# Patient Record
Sex: Male | Born: 1955 | Race: White | Hispanic: No | Marital: Married | Smoking: Never smoker
Health system: Southern US, Community
[De-identification: ages and names within clinical notes are randomized; demographics above are authoritative.]

## PROBLEM LIST (undated history)

## (undated) DIAGNOSIS — E079 Disorder of thyroid, unspecified: Secondary | ICD-10-CM

---

## 2015-06-05 ENCOUNTER — Encounter (HOSPITAL_COMMUNITY): Payer: Self-pay | Admitting: *Deleted

## 2015-06-05 ENCOUNTER — Emergency Department (HOSPITAL_COMMUNITY): Payer: BLUE CROSS/BLUE SHIELD

## 2015-06-05 ENCOUNTER — Emergency Department (HOSPITAL_COMMUNITY)
Admission: EM | Admit: 2015-06-05 | Discharge: 2015-06-05 | Disposition: A | Discharge status: Home / Self Care | Payer: BLUE CROSS/BLUE SHIELD | Attending: Emergency Medicine | Admitting: Emergency Medicine

## 2015-06-05 DIAGNOSIS — Z880 Allergy status to penicillin: Secondary | ICD-10-CM | POA: Insufficient documentation

## 2015-06-05 DIAGNOSIS — Y9389 Activity, other specified: Secondary | ICD-10-CM | POA: Insufficient documentation

## 2015-06-05 DIAGNOSIS — Y92410 Unspecified street and highway as the place of occurrence of the external cause: Secondary | ICD-10-CM | POA: Diagnosis not present

## 2015-06-05 DIAGNOSIS — S2231XA Fracture of one rib, right side, initial encounter for closed fracture: Secondary | ICD-10-CM

## 2015-06-05 DIAGNOSIS — S3992XA Unspecified injury of lower back, initial encounter: Secondary | ICD-10-CM | POA: Diagnosis not present

## 2015-06-05 DIAGNOSIS — Y998 Other external cause status: Secondary | ICD-10-CM | POA: Insufficient documentation

## 2015-06-05 DIAGNOSIS — S29001A Unspecified injury of muscle and tendon of front wall of thorax, initial encounter: Secondary | ICD-10-CM | POA: Diagnosis present

## 2015-06-05 DIAGNOSIS — S199XXA Unspecified injury of neck, initial encounter: Secondary | ICD-10-CM | POA: Insufficient documentation

## 2015-06-05 DIAGNOSIS — S0990XA Unspecified injury of head, initial encounter: Secondary | ICD-10-CM | POA: Insufficient documentation

## 2015-06-05 DIAGNOSIS — S2241XA Multiple fractures of ribs, right side, initial encounter for closed fracture: Secondary | ICD-10-CM | POA: Insufficient documentation

## 2015-06-05 DIAGNOSIS — Z8639 Personal history of other endocrine, nutritional and metabolic disease: Secondary | ICD-10-CM | POA: Diagnosis not present

## 2015-06-05 HISTORY — DX: Disorder of thyroid, unspecified: E07.9

## 2015-06-05 MED ORDER — METHOCARBAMOL 500 MG PO TABS: 500.0000 mg | ORAL_TABLET | Freq: Two times a day (BID) | ORAL | Status: AC

## 2015-06-05 MED ORDER — METHOCARBAMOL 500 MG PO TABS
500.0000 mg | ORAL_TABLET | Freq: Once | ORAL | Status: AC
  Administered 2015-06-05: 500 mg via ORAL
  Filled 2015-06-05: qty 1

## 2015-06-05 MED ORDER — OXYCODONE-ACETAMINOPHEN 5-325 MG PO TABS: 1.0000 | ORAL_TABLET | Freq: Four times a day (QID) | ORAL | Status: AC | PRN

## 2015-06-05 NOTE — Discharge Instructions (Signed)
1. Medications: Pain medication and muscle relaxer only as needed - This can make you very drowsy - please do not drink or drive on this medication, continue usual home medications 2. Treatment: rest, use incentive spirometer throughout the day.  3. Follow Up: Please follow up with your primary doctor in 5-7 days for discussion of your diagnoses and further evaluation after today's visit; Please return to the ER for new or worsening symptoms, any additional concerns.    Rib Fracture A rib fracture is a break or crack in one of the bones of the ribs. The ribs are a group of long, curved bones that wrap around your chest and attach to your spine. They protect your lungs and other organs in the chest cavity. A broken or cracked rib is often painful, but most do not cause other problems. Most rib fractures heal on their own over time. However, rib fractures can be more serious if multiple ribs are broken or if broken ribs move out of place and push against other structures. CAUSES   A direct blow to the chest. For example, this could happen during contact sports, a car accident, or a fall against a hard object.  Repetitive movements with high force, such as pitching a baseball or having severe coughing spells. SYMPTOMS   Pain when you breathe in or cough.  Pain when someone presses on the injured area. DIAGNOSIS  Your caregiver will perform a physical exam. Various imaging tests may be ordered to confirm the diagnosis and to look for related injuries. These tests may include a chest X-ray, computed tomography (CT), magnetic resonance imaging (MRI), or a bone scan. TREATMENT  Rib fractures usually heal on their own in 1-3 months. The longer healing period is often associated with a continued cough or other aggravating activities. During the healing period, pain control is very important. Medication is usually given to control pain. Hospitalization or surgery may be needed for more severe injuries,  such as those in which multiple ribs are broken or the ribs have moved out of place.  HOME CARE INSTRUCTIONS   Avoid strenuous activity and any activities or movements that cause pain. Be careful during activities and avoid bumping the injured rib.  Gradually increase activity as directed by your caregiver.  Only take over-the-counter or prescription medications as directed by your caregiver. Do not take other medications without asking your caregiver first.  Apply ice to the injured area for the first 1-2 days after you have been treated or as directed by your caregiver. Applying ice helps to reduce inflammation and pain.  Put ice in a plastic bag.  Place a towel between your skin and the bag.   Leave the ice on for 15-20 minutes at a time, every 2 hours while you are awake.  Perform deep breathing as directed by your caregiver. This will help prevent pneumonia, which is a common complication of a broken rib. Your caregiver may instruct you to:  Take deep breaths several times a day.  Try to cough several times a day, holding a pillow against the injured area.  Use a device called an incentive spirometer to practice deep breathing several times a day.  Drink enough fluids to keep your urine clear or pale yellow. This will help you avoid constipation.   Do not wear a rib belt or binder. These restrict breathing, which can lead to pneumonia.  SEEK IMMEDIATE MEDICAL CARE IF:   You have a fever.   You have difficulty  breathing or shortness of breath.   You develop a continual cough, or you cough up thick or bloody sputum.  You feel sick to your stomach (nausea), throw up (vomit), or have abdominal pain.   You have worsening pain not controlled with medications.  MAKE SURE YOU:  Understand these instructions.  Will watch your condition.  Will get help right away if you are not doing well or get worse.   This information is not intended to replace advice given to you  by your health care provider. Make sure you discuss any questions you have with your health care provider.   Document Released: 03/19/2005 Document Revised: 11/19/2012 Document Reviewed: 05/21/2012 Elsevier Interactive Patient Education [employer]2016 [employer]

## 2015-06-05 NOTE — ED Notes (Signed)
Pt transported to xray 
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
